# Patient Record
Sex: Female | Born: 1970 | Hispanic: Yes | Smoking: Never smoker
Health system: Southern US, Community
[De-identification: ages and names within clinical notes are randomized; demographics above are authoritative.]

## PROBLEM LIST (undated history)

## (undated) DIAGNOSIS — I10 Essential (primary) hypertension: Secondary | ICD-10-CM

## (undated) DIAGNOSIS — E119 Type 2 diabetes mellitus without complications: Secondary | ICD-10-CM

## (undated) HISTORY — DX: Type 2 diabetes mellitus without complications: E11.9

## (undated) HISTORY — DX: Essential (primary) hypertension: I10

## (undated) HISTORY — PX: BREAST SURGERY: SHX581

## (undated) HISTORY — PX: FRACTURE SURGERY: SHX138

---

## 2012-12-14 ENCOUNTER — Other Ambulatory Visit: Payer: Self-pay | Admitting: Family Medicine

## 2012-12-14 DIAGNOSIS — R921 Mammographic calcification found on diagnostic imaging of breast: Secondary | ICD-10-CM

## 2012-12-26 ENCOUNTER — Inpatient Hospital Stay
Admission: RE | Admit: 2012-12-26 | Discharge: 2012-12-26 | Payer: Self-pay | Source: Ambulatory Visit | Attending: Family Medicine | Admitting: Family Medicine

## 2013-01-04 ENCOUNTER — Ambulatory Visit
Admission: RE | Admit: 2013-01-04 | Discharge: 2013-01-04 | Disposition: A | Discharge status: Home / Self Care | Payer: No Typology Code available for payment source | Source: Ambulatory Visit | Attending: Family Medicine | Admitting: Family Medicine

## 2013-01-04 ENCOUNTER — Other Ambulatory Visit: Payer: Self-pay | Admitting: Family Medicine

## 2013-01-04 DIAGNOSIS — R921 Mammographic calcification found on diagnostic imaging of breast: Secondary | ICD-10-CM

## 2013-01-04 HISTORY — PX: BREAST BIOPSY: SHX20

## 2020-11-09 ENCOUNTER — Other Ambulatory Visit (HOSPITAL_COMMUNITY): Payer: Self-pay | Admitting: Nurse Practitioner

## 2020-11-09 DIAGNOSIS — Z1231 Encounter for screening mammogram for malignant neoplasm of breast: Secondary | ICD-10-CM

## 2020-11-12 ENCOUNTER — Other Ambulatory Visit: Payer: Self-pay

## 2020-11-12 ENCOUNTER — Ambulatory Visit (HOSPITAL_COMMUNITY)
Admission: RE | Admit: 2020-11-12 | Discharge: 2020-11-12 | Disposition: A | Discharge status: Home / Self Care | Payer: Self-pay | Source: Ambulatory Visit | Attending: Nurse Practitioner | Admitting: Nurse Practitioner

## 2020-11-12 DIAGNOSIS — Z1231 Encounter for screening mammogram for malignant neoplasm of breast: Secondary | ICD-10-CM | POA: Insufficient documentation

## 2020-11-18 ENCOUNTER — Other Ambulatory Visit (HOSPITAL_COMMUNITY): Payer: Self-pay | Admitting: Nurse Practitioner

## 2020-11-25 ENCOUNTER — Other Ambulatory Visit (HOSPITAL_COMMUNITY): Payer: Self-pay | Admitting: Nurse Practitioner

## 2020-11-25 DIAGNOSIS — R928 Other abnormal and inconclusive findings on diagnostic imaging of breast: Secondary | ICD-10-CM

## 2020-12-01 ENCOUNTER — Encounter: Payer: Self-pay | Admitting: Adult Health

## 2020-12-10 ENCOUNTER — Encounter (HOSPITAL_COMMUNITY): Payer: Self-pay

## 2020-12-10 ENCOUNTER — Other Ambulatory Visit (HOSPITAL_COMMUNITY): Payer: Self-pay

## 2020-12-16 ENCOUNTER — Encounter: Payer: Self-pay | Admitting: Adult Health

## 2020-12-16 ENCOUNTER — Other Ambulatory Visit: Payer: Self-pay

## 2020-12-16 ENCOUNTER — Ambulatory Visit (INDEPENDENT_AMBULATORY_CARE_PROVIDER_SITE_OTHER): Payer: Self-pay | Admitting: Adult Health

## 2020-12-16 VITALS — BP 146/79 | HR 88 | Ht 62.0 in | Wt 206.5 lb

## 2020-12-16 DIAGNOSIS — N950 Postmenopausal bleeding: Secondary | ICD-10-CM

## 2020-12-16 NOTE — Progress Notes (Signed)
Patient ID: Michele Franklin, female   DOB: 1971-07-17, 50 y.o.   MRN: [id number] History of Present Illness: Michele Franklin is a 50 year old Hispanic female, female, PM, referred by [facility] for PMB, had not had period in over  a year and had bleeding in November and December and had some cramping and clots in November. She said she had physical at health dept with pap in December 2021.  She is self pay, applying for [employer].  She has interrupter with her. PCP is [facility].   Current Medications, Allergies, Past Medical History, Past Surgical History, Family History and Social History were reviewed in [employer] record.     Review of Systems: Had not had a period in over  a year or so, and had bleeding in November and December +cramoing and clots in november Not having sex, husband has  cancer     Physical Exam:BP (!) 146/79 (BP Location: Left Arm, Patient Position: Sitting, Cuff Size: Large)   Pulse 88   Ht 5\' 2"  (1.575 m)   Wt 206 lb 8 oz (93.7 kg)   BMI 37.77 kg/m  General:  Well developed, well nourished, no acute distress Skin:  Warm and dry Lungs; Clear to auscultation bilaterally Cardiovascular: Regular rate and rhythm Pelvic:  External genitalia is normal in appearance, no lesions.  The vagina is normal in appearance. Urethra has no lesions or masses. The cervix is bulbous.  Uterus is felt to be normal size, shape, and contour.  No adnexal masses or tenderness noted.Bladder is non tender, no masses felt. Psych:  No mood changes, alert and cooperative,seems worried. AA is 0 Fall risk is moderate PHQ 9 score is 5  GAD 7 score is 6, she says worried over husband and his condition  Upstream - 12/16/20 1518      Pregnancy Intention Screening   Does the patient want to become pregnant in the next year? N/A    Does the patient's partner want to become pregnant in the next year? N/A    Would the patient like to discuss contraceptive options today? N/A       Contraception Wrap Up   Current Method Abstinence    End Method Abstinence    Contraception Counseling Provided No         Examination chaperoned by 12/18/20 LPN.   Impression and Plan: 1. PMB (postmenopausal bleeding) Will get pelvic Malachy Mood 12/24/20 at [facility] at 3:30 pm  Explained that this is to assess the uterus and endometrial lining and if lining is thickened will need endometrial biopsy  Will get Daisy to call her with results, vs a visit since she cares for husband  Review handout on PMB in Spanish

## 2020-12-16 NOTE — Patient Instructions (Signed)
Hemorragia posmenopusica Postmenopausal Bleeding La hemorragia posmenopusica es cualquier sangrado que ocurre despus de la menopausia. La menopausia es un momento de la vida de una mujer en el que se detienen los perodos [place]. El mdico debe controlar cualquier tipo de sangrado que tenga despus de la menopausia. El tratamiento depender de la causa. Este tipo de hemorragia puede ser causada por:  El uso de hormonas durante la menopausia.  Cantidades bajas o altas de hormonas femeninas en el cuerpo. Esto puede hacer que el revestimiento del utero se vuelva demasiado delgado o demasiado grueso.  Cncer.  Crecimientos en el tero que no son cncer. Siga estas instrucciones en su casa:  Controle si hay algn cambio en sus sntomas. Informe a su mdico acerca de los cambios.  Evite usar tampones y Geographical information systems officer duchas vaginales como se lo haya indicado el mdico.  Cmbiese las toallas higinicas de forma regular.  Hgase exmenes plvicos regulares. Esto incluye las pruebas de [place].  Tome comprimidos de hierro como se lo haya indicado el mdico.  Use los medicamentos de venta libre y los recetados solamente como se lo haya indicado el mdico.  Cumpla con todas las visitas de seguimiento.   Comunquese con un mdico si:  Tiene un nuevo sangrado de la vagina despus de la menopausia.  Siente dolor en el vientre (abdomen). Solicite ayuda de inmediato si:  Tiene fiebre o escalofros.  Tiene un dolor muy intenso con sangrado.  Elimina grumos de sangre (cogulos de [place]) por la vagina.  Tiene mucho sangrado y: ? [place] ms de 1 compresa por hora. ? Es la primera vez que tiene un sangrado de [place] tipo.  Tiene dolores de [place].  Se siente mareado o como si se fuera a desmayar. Resumen  El mdico debe controlar cualquier tipo de sangrado que tenga despus de la menopausia.  Evite usar tampones o duchas vaginales.  Hgase exmenes plvicos regulares. Esto incluye las  pruebas de [place].  Pngase en contacto con un mdico si tiene sangrado o dolor nuevo en el vientre.  Controle si hay algn cambio en sus sntomas. Informe a su mdico acerca de los cambios. Esta informacin no tiene Theme park manager el consejo del mdico. Asegrese de hacerle al mdico cualquier pregunta que tenga. Document Revised: 06/16/2020 Document Reviewed: 06/16/2020 Elsevier Patient Education  2021 [employer].

## 2020-12-22 ENCOUNTER — Other Ambulatory Visit (HOSPITAL_COMMUNITY): Payer: Self-pay | Admitting: *Deleted

## 2020-12-22 ENCOUNTER — Other Ambulatory Visit: Payer: Self-pay

## 2020-12-22 ENCOUNTER — Ambulatory Visit (HOSPITAL_COMMUNITY)
Admission: RE | Admit: 2020-12-22 | Discharge: 2020-12-22 | Disposition: A | Discharge status: Home / Self Care | Payer: Self-pay | Source: Ambulatory Visit | Attending: *Deleted | Admitting: *Deleted

## 2020-12-22 DIAGNOSIS — R059 Cough, unspecified: Secondary | ICD-10-CM

## 2020-12-24 ENCOUNTER — Ambulatory Visit (HOSPITAL_COMMUNITY)
Admission: RE | Admit: 2020-12-24 | Discharge: 2020-12-24 | Disposition: A | Discharge status: Home / Self Care | Payer: Self-pay | Source: Ambulatory Visit | Attending: Adult Health | Admitting: Adult Health

## 2020-12-24 ENCOUNTER — Other Ambulatory Visit: Payer: Self-pay

## 2020-12-24 DIAGNOSIS — N950 Postmenopausal bleeding: Secondary | ICD-10-CM | POA: Insufficient documentation

## 2020-12-29 ENCOUNTER — Ambulatory Visit (HOSPITAL_COMMUNITY)
Admission: RE | Admit: 2020-12-29 | Discharge: 2020-12-29 | Disposition: A | Discharge status: Home / Self Care | Payer: PRIVATE HEALTH INSURANCE | Source: Ambulatory Visit | Attending: Nurse Practitioner | Admitting: Nurse Practitioner

## 2020-12-29 ENCOUNTER — Other Ambulatory Visit: Payer: Self-pay

## 2020-12-29 ENCOUNTER — Other Ambulatory Visit: Payer: Self-pay | Admitting: Adult Health

## 2020-12-29 DIAGNOSIS — R928 Other abnormal and inconclusive findings on diagnostic imaging of breast: Secondary | ICD-10-CM | POA: Insufficient documentation

## 2020-12-29 MED ORDER — MEDROXYPROGESTERONE ACETATE 10 MG PO TABS: ORAL_TABLET | ORAL | 12 refills | Status: AC

## 2021-04-16 ENCOUNTER — Ambulatory Visit (INDEPENDENT_AMBULATORY_CARE_PROVIDER_SITE_OTHER): Payer: Self-pay

## 2021-04-16 ENCOUNTER — Other Ambulatory Visit: Payer: Self-pay

## 2021-04-16 DIAGNOSIS — Z23 Encounter for immunization: Secondary | ICD-10-CM

## 2021-04-16 NOTE — Progress Notes (Signed)
   Covid-19 Vaccination Clinic  Name:  Michele Franklin    MRN: [id number] DOB: [birth year]  04/16/2021  [name] was observed post Covid-19 immunization for 15 minutes in office without incident. She was provided with Vaccine Information Sheet and instruction to access the V-Safe system.   [name] was instructed to call 911 with any severe reactions post vaccine: Marland Kitchen Difficulty breathing  . Swelling of face and throat  . A fast heartbeat  . A bad rash all over body  . Dizziness and weakness   Immunizations Administered    Name Date Dose VIS Date Route   PFIZER Comrnaty(Gray TOP) Covid-19 Vaccine 04/16/2021  4:17 PM 0.3 mL 11/05/2020 Intramuscular   Manufacturer: [employer], [employer]   Lot: [id number]   NDC: [phone]

## 2021-07-20 ENCOUNTER — Other Ambulatory Visit: Payer: Self-pay

## 2021-07-20 DIAGNOSIS — N6310 Unspecified lump in the right breast, unspecified quadrant: Secondary | ICD-10-CM

## 2021-07-22 NOTE — Addendum Note (Signed)
Addended by: Narda Rutherford on: 07/22/2021 02:13 PM   Modules accepted: Orders

## 2021-07-23 ENCOUNTER — Other Ambulatory Visit: Payer: Self-pay

## 2021-07-23 ENCOUNTER — Ambulatory Visit: Payer: PRIVATE HEALTH INSURANCE | Admitting: *Deleted

## 2021-07-23 VITALS — BP 149/71 | Wt 223.5 lb

## 2021-07-23 DIAGNOSIS — Z1239 Encounter for other screening for malignant neoplasm of breast: Secondary | ICD-10-CM

## 2021-07-23 NOTE — Patient Instructions (Addendum)
Explained breast self awareness with Hortencia Pilar. Patient did not need a Pap smear today due to last Pap smear and HPV typing was 11/09/2020. Let her know BCCCP will cover Pap smears and HPV typing every 5 years unless has a history of abnormal Pap smears. Referred patient to [facility] for a diagnostic mammogram per recommendation. Appointment scheduled Tuesday, August 31, 2021 at 1440. Patient aware of appointment and will be there. Allison Deshotels verbalized understanding.  Camesha Farooq, Kathaleen Maser, RN 12:38 PM

## 2021-07-23 NOTE — Progress Notes (Signed)
Michele Franklin is a 50 y.o. female who presents to [facility] clinic today with no complaints. Patient referred to BCCCP by the [facility] due to her last diagnostic mammogram 12/29/2020 recommended 55-month diagnostic mammogram for follow-up.    Pap Smear: Pap smear not completed today. Last Pap smear was 11/09/2020 at the [facility] clinic and was normal with negative HPV. Per patient has no history of an abnormal Pap smear. Last Pap smear result is not available in Epic. Last Pap smear result will be scanned into Epic.   Physical exam: Breasts Breasts symmetrical. No skin abnormalities bilateral breasts. No nipple retraction bilateral breasts. No nipple discharge bilateral breasts. No lymphadenopathy. No lumps palpated bilateral breasts. No complaints of pain or tenderness on exam.     MS DIGITAL SCREENING TOMO BILATERAL  Result Date: 11/18/2020 CLINICAL DATA:  Screening. EXAM: DIGITAL SCREENING BILATERAL MAMMOGRAM WITH TOMO AND CAD COMPARISON:  Post biopsy left breast mammograms dated 01/04/2013. No other prior exams available. ACR Breast Density Category b: There are scattered areas of fibroglandular density. FINDINGS: In the right breast, 2 possible masses warrant further evaluation. In the left breast, no findings suspicious for malignancy. Images were processed with CAD. IMPRESSION: Further evaluation is suggested for possible masses in the right breast. RECOMMENDATION: Diagnostic mammogram and possibly ultrasound of the right breast. (Code:FI-R-16M) The patient will be contacted regarding the findings, and additional imaging will be scheduled. BI-RADS CATEGORY  0: Incomplete. Need additional imaging evaluation and/or prior mammograms for comparison. Electronically Signed   By: Amie Portland M.D.   On: 11/18/2020 07:58   MS DIGITAL DIAG TOMO UNI RIGHT  Result Date: 12/29/2020 CLINICAL DATA:  Screening recall for right breast masses. EXAM:  DIGITAL DIAGNOSTIC UNILATERAL RIGHT MAMMOGRAM WITH TOMO AND CAD; ULTRASOUND RIGHT BREAST LIMITED TECHNIQUE: Right digital diagnostic mammography and breast tomosynthesis was performed. Digital images of the breasts were evaluated with computer-aided detection. COMPARISON:  Prior exams. Note that patient does have prior mammograms from [facility] under the name Lovett Calender [phone]) ACR Breast Density Category b: There are scattered areas of fibroglandular density. FINDINGS: Spot compression tomograms were performed of the right breast. There is an oval circumscribed with associated punctate calcifications mass in the inner right breast measuring 0.6 cm. This may have been present on prior mammograms although difficult to determine due to differences in technique. There is an oval circumscribed mass in the outer right breast measuring 0.4 cm. Targeted ultrasound of the right breast was performed. There is an oil cyst at 9 o'clock 1 cm from the nipple measuring 0.4 x 0.4 x 0.4 cm. This corresponds with an oil cyst and in the outer right breast at mammography. No suspicious masses or abnormality seen in outer right breast. No sonographic correlate is seen for the oval circumscribed mass seen in the outer periareolar right breast at mammography. There is an oval circumscribed hypoechoic mass in the right breast at 2 o'clock 2 cm from nipple measuring 0.7 x 0.2 x 0.5 cm. This corresponds well with the in the inner right breast at mammography. IMPRESSION: Probably benign right breast masses. RECOMMENDATION: 1. Diagnostic mammography of the right breast in 6 months with ultrasound. 2. Note that patient does have prior mammograms from [facility] under the name Lovett Calender [phone]) I have discussed the findings and recommendations with the patient. If applicable, a reminder letter will be sent to the patient regarding the next appointment. BI-RADS CATEGORY  3: Probably benign. Electronically Signed  By: Edwin Cap M.D.   On: 12/29/2020 15:23    Pelvic/Bimanual Pap is not indicated today per BCCCP guidelines.   Smoking History: Patient has never smoked.   Patient Navigation: Patient education provided. Access to services provided for patient through [facility] program. Spanish interpreter Aurelio Brash from [facility] provided.   Colorectal Cancer Screening: Per patient has never had colonoscopy completed. No complaints today.    Breast and Cervical Cancer Risk Assessment: Patient has family history of a sister having breast cancer. Patient has no known genetic mutations or history of radiation treatment to the chest before age 70. Patient does not have history of cervical dysplasia, immunocompromised, or DES exposure in-utero.  Risk Assessment     Risk Scores       07/23/2021   Last edited by: Meryl Dare, CMA   5-year risk: 1.7 %   Lifetime risk: 14.1 %           A: BCCCP exam without pap smear No complaints.  P: Referred patient to [facility] for a diagnostic mammogram per recommendation. Appointment scheduled Tuesday, August 31, 2021 at 1440.  Priscille Heidelberg, RN 07/23/2021 12:38 PM

## 2021-08-12 ENCOUNTER — Ambulatory Visit (HOSPITAL_COMMUNITY)
Admission: RE | Admit: 2021-08-12 | Discharge: 2021-08-12 | Disposition: A | Discharge status: Home / Self Care | Payer: Self-pay | Source: Ambulatory Visit | Attending: Obstetrics and Gynecology | Admitting: Obstetrics and Gynecology

## 2021-08-12 ENCOUNTER — Other Ambulatory Visit: Payer: Self-pay

## 2021-08-12 DIAGNOSIS — N6310 Unspecified lump in the right breast, unspecified quadrant: Secondary | ICD-10-CM | POA: Insufficient documentation

## 2021-08-31 ENCOUNTER — Encounter (HOSPITAL_COMMUNITY): Payer: PRIVATE HEALTH INSURANCE

## 2021-08-31 ENCOUNTER — Other Ambulatory Visit (HOSPITAL_COMMUNITY): Payer: PRIVATE HEALTH INSURANCE

## 2021-12-08 ENCOUNTER — Other Ambulatory Visit (HOSPITAL_COMMUNITY): Payer: Self-pay | Admitting: Obstetrics and Gynecology

## 2021-12-08 DIAGNOSIS — Z1231 Encounter for screening mammogram for malignant neoplasm of breast: Secondary | ICD-10-CM

## 2021-12-31 ENCOUNTER — Ambulatory Visit (HOSPITAL_COMMUNITY)
Admission: RE | Admit: 2021-12-31 | Discharge: 2021-12-31 | Disposition: A | Discharge status: Home / Self Care | Payer: PRIVATE HEALTH INSURANCE | Source: Ambulatory Visit | Attending: Obstetrics and Gynecology | Admitting: Obstetrics and Gynecology

## 2021-12-31 ENCOUNTER — Other Ambulatory Visit: Payer: Self-pay

## 2021-12-31 DIAGNOSIS — Z1231 Encounter for screening mammogram for malignant neoplasm of breast: Secondary | ICD-10-CM | POA: Insufficient documentation

## 2022-09-03 IMAGING — MG MM DIGITAL SCREENING BILAT W/ TOMO AND CAD
6 of 12 series · 6 of 36 positions shown · non-contrast
Comparison: Previous exam(s).

CLINICAL DATA: Screening.

EXAM:
DIGITAL SCREENING BILATERAL MAMMOGRAM WITH TOMOSYNTHESIS AND CAD
TECHNIQUE: Bilateral screening digital craniocaudal and mediolateral oblique
mammograms were obtained. Bilateral screening digital breast
tomosynthesis was performed. The images were evaluated with
computer-aided detection.

[R CC synth-2D]
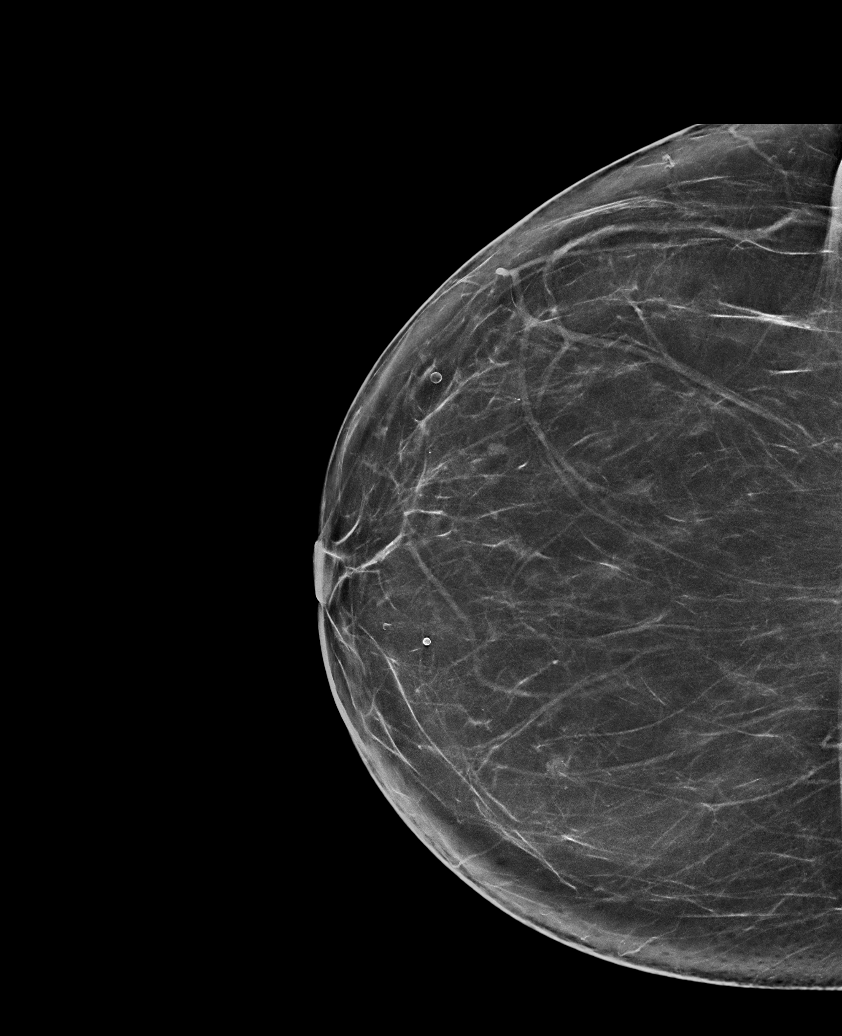

[R MLO synth-2D (1 of 2)]
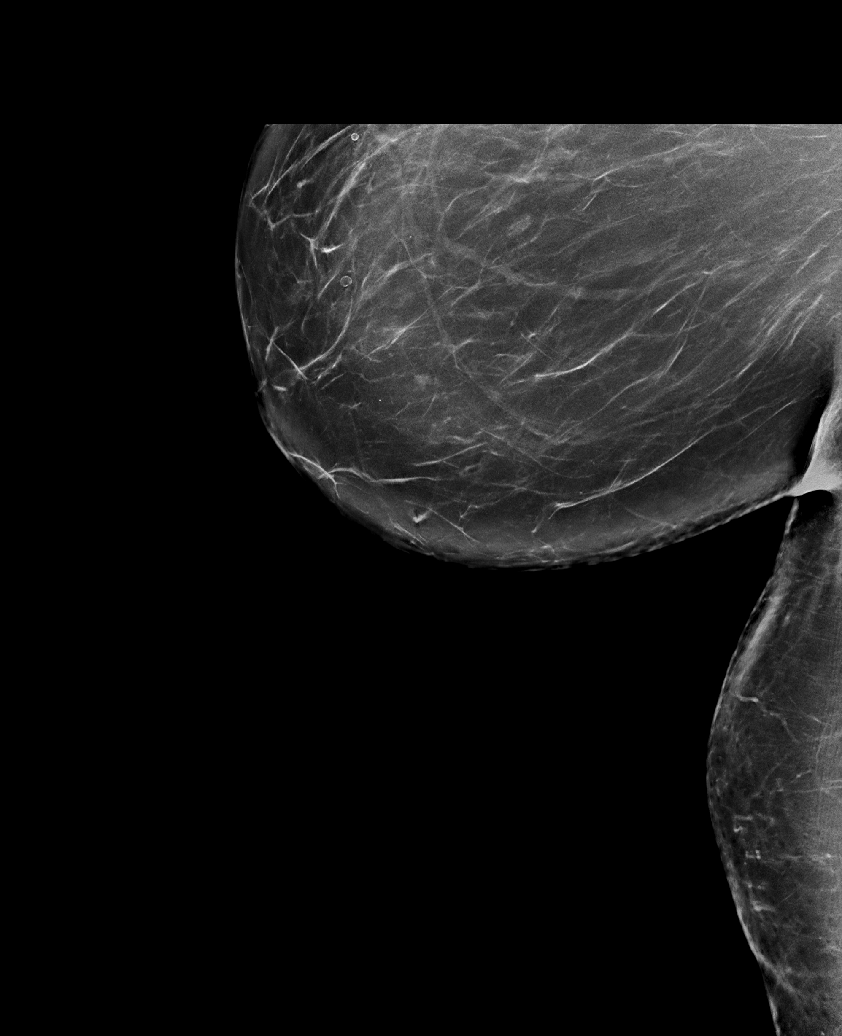

[L CC synth-2D]
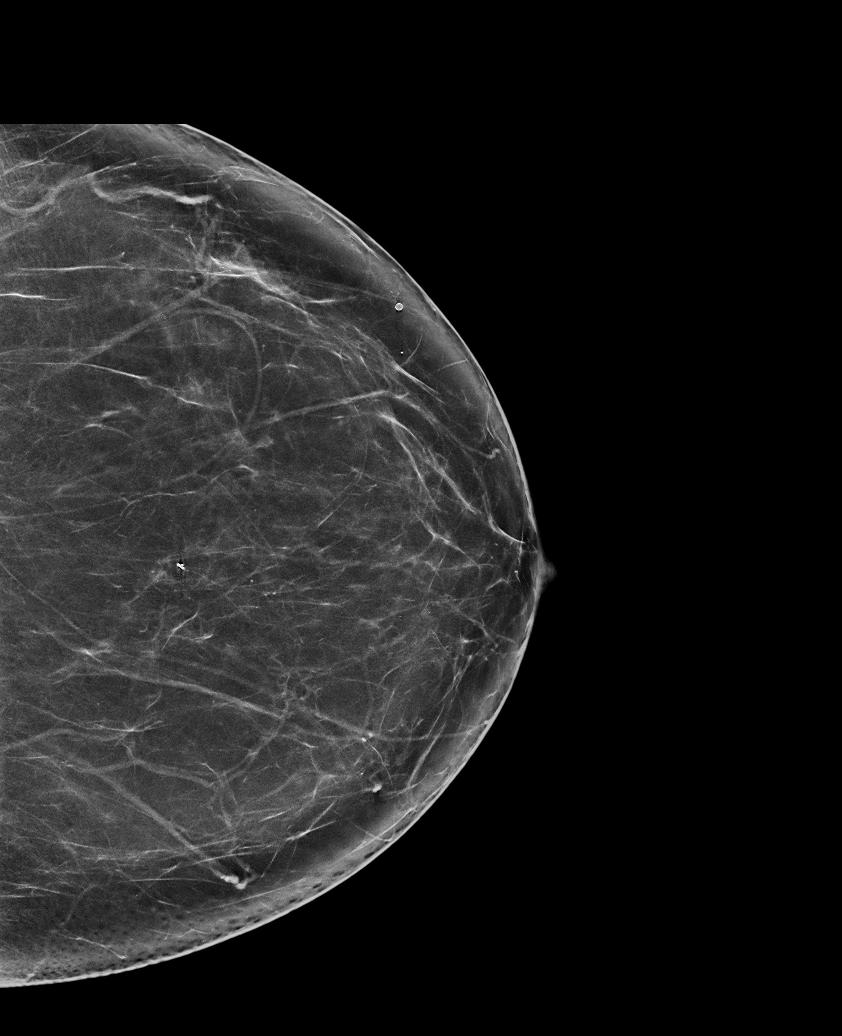

[L MLO synth-2D (1 of 2)]
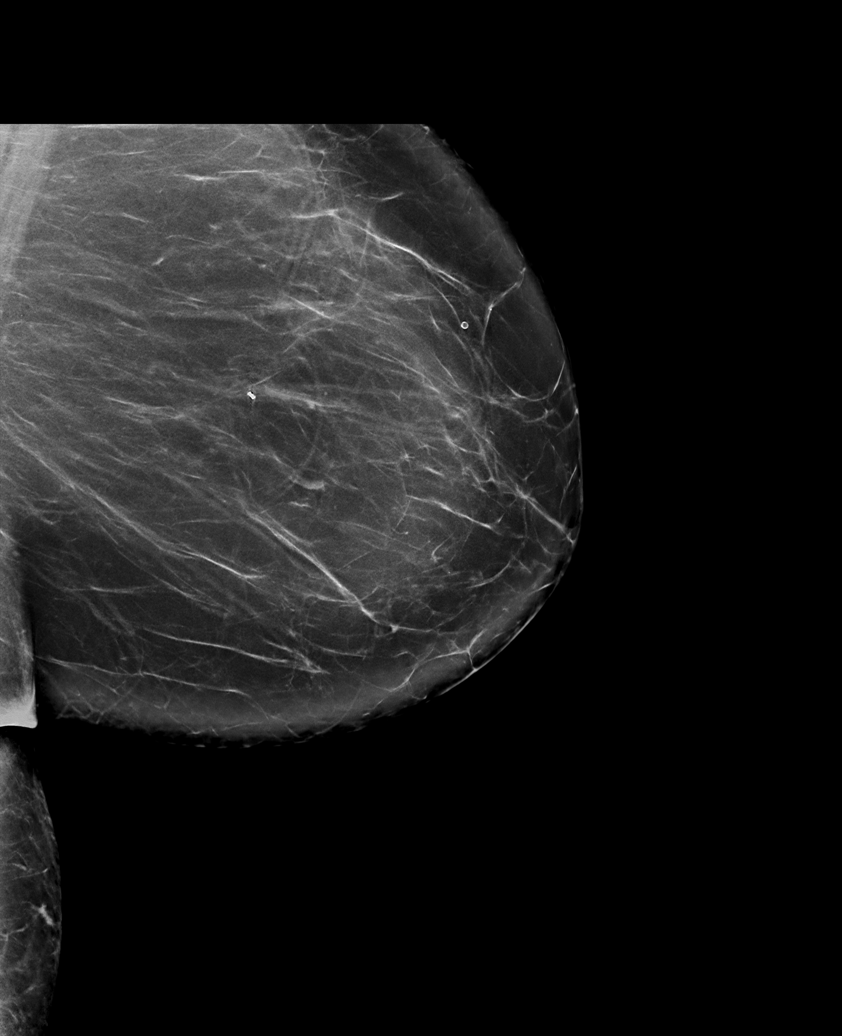

[L MLO synth-2D (2 of 2)]
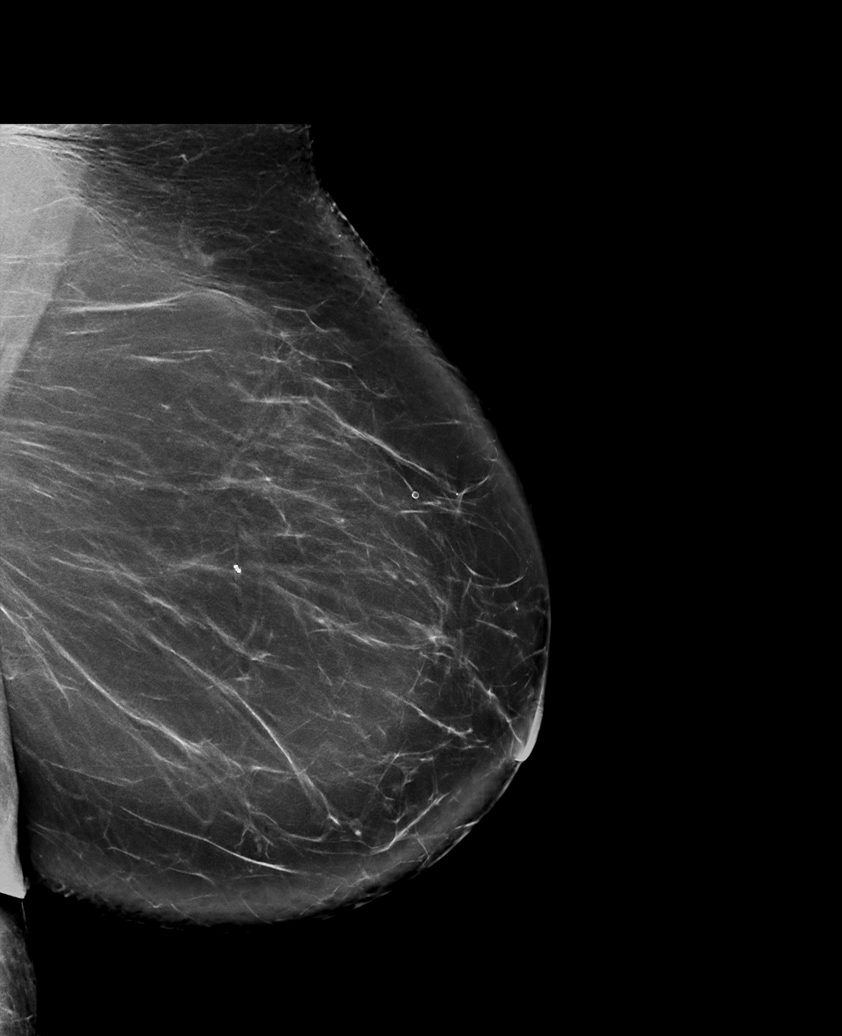

[R MLO synth-2D (2 of 2)]
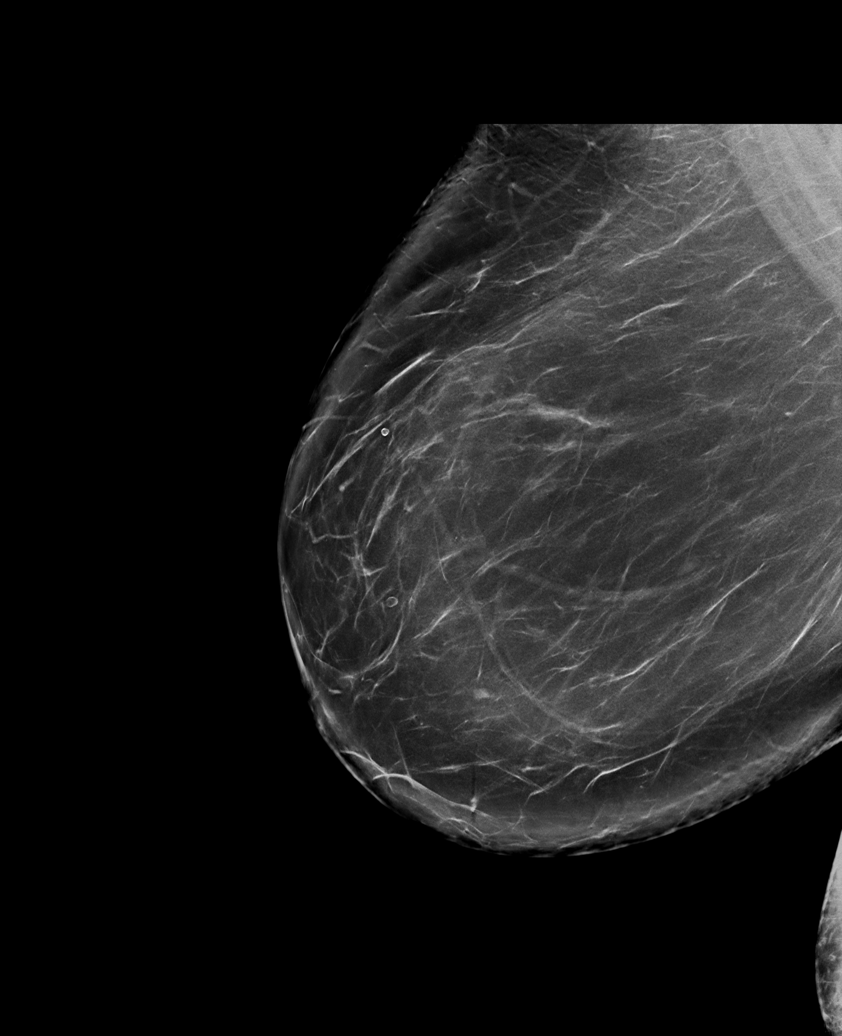

[6 of 36 positions shown; findings below may reference images not displayed]

ACR Breast Density Category b: There are scattered areas of
fibroglandular density.
FINDINGS: There are no findings suspicious for malignancy.
IMPRESSION: No mammographic evidence of malignancy. A result letter of this
screening mammogram will be mailed directly to the patient.

RECOMMENDATION:
Screening mammogram in one year. (Code:[id number])

BI-RADS CATEGORY  1: Negative.

## 2023-04-03 ENCOUNTER — Other Ambulatory Visit: Payer: Self-pay | Admitting: *Deleted

## 2023-04-03 DIAGNOSIS — Z1231 Encounter for screening mammogram for malignant neoplasm of breast: Secondary | ICD-10-CM

## 2023-04-14 ENCOUNTER — Inpatient Hospital Stay (HOSPITAL_COMMUNITY): Admission: RE | Admit: 2023-04-14 | Payer: PRIVATE HEALTH INSURANCE | Source: Ambulatory Visit

## 2023-04-26 ENCOUNTER — Ambulatory Visit (HOSPITAL_COMMUNITY)
Admission: RE | Admit: 2023-04-26 | Discharge: 2023-04-26 | Disposition: A | Discharge status: Home / Self Care | Payer: PRIVATE HEALTH INSURANCE | Source: Ambulatory Visit | Attending: *Deleted | Admitting: *Deleted

## 2023-04-26 DIAGNOSIS — Z1231 Encounter for screening mammogram for malignant neoplasm of breast: Secondary | ICD-10-CM | POA: Insufficient documentation

## 2023-09-05 ENCOUNTER — Telehealth: Payer: Self-pay

## 2023-09-05 NOTE — Telephone Encounter (Signed)
[facility] Connect wellness call and follow up by Audree Camel BSW intern with assistance of interpreter ID [phone] PCP Health Department/ next appointment 09/07/23 at 10:30 am She uses [facility] pharmacy at [facility] department  SDOH concerns: Food no concerns Housing No needs Safety feels safe Transportation No needs.  No other needs or BH needs reported at this time.  Francee Nodal RN  [employer]

## 2024-04-05 ENCOUNTER — Telehealth: Payer: Self-pay

## 2024-04-05 NOTE — Telephone Encounter (Signed)
 Attempted to call Care Connect client/Health Department client who is diabetic. Last seen at Health Department for primary care on 02/01/24 and has no future appointments scheduled.  Last A1C on 02/01/24 9.6 increased from previous result of 7.9 on 10/06/23.  Will plan to continue to attempt to reach by phone and monitor for updated appointments. Will also mail a letter in Spanish to client as reminder to schedule an appointment or call care connect for assistance.  No answer on call today with interpreter services , left message requesting return call.   Kris Pester RN [employer]

## 2024-05-10 ENCOUNTER — Other Ambulatory Visit: Payer: Self-pay | Admitting: *Deleted

## 2024-05-10 DIAGNOSIS — Z1231 Encounter for screening mammogram for malignant neoplasm of breast: Secondary | ICD-10-CM

## 2024-05-29 ENCOUNTER — Encounter (HOSPITAL_COMMUNITY): Payer: Self-pay

## 2024-05-29 ENCOUNTER — Ambulatory Visit (HOSPITAL_COMMUNITY)
Admission: RE | Admit: 2024-05-29 | Discharge: 2024-05-29 | Disposition: A | Discharge status: Home / Self Care | Payer: Self-pay | Source: Ambulatory Visit | Attending: *Deleted | Admitting: *Deleted

## 2024-05-29 DIAGNOSIS — Z1231 Encounter for screening mammogram for malignant neoplasm of breast: Secondary | ICD-10-CM | POA: Insufficient documentation

## 2024-06-04 ENCOUNTER — Other Ambulatory Visit: Payer: Self-pay

## 2024-06-04 DIAGNOSIS — N6310 Unspecified lump in the right breast, unspecified quadrant: Secondary | ICD-10-CM

## 2024-06-13 ENCOUNTER — Ambulatory Visit: Payer: Self-pay | Admitting: *Deleted

## 2024-06-13 VITALS — BP 151/89 | Ht 62.0 in | Wt 219.0 lb

## 2024-06-13 DIAGNOSIS — Z1239 Encounter for other screening for malignant neoplasm of breast: Secondary | ICD-10-CM

## 2024-06-13 NOTE — Progress Notes (Signed)
 Ms. Michele Franklin is a 53 y.o. female who presents to [facility] clinic today with no complaints. Patient had a screening mammogram completed 05/29/2024 that additional imaging of the right breast is recommended for follow up.   Pap Smear: Pap smear not completed today. Last Pap smear was 05/08/2024 at the [facility] clinic and was normal with negative HPV per patient. Per patient has no history of an abnormal Pap smear. Last Pap smear result is not available in Epic.   Physical exam: Breasts Breasts symmetrical. No skin abnormalities bilateral breasts. No nipple retraction bilateral breasts. No nipple discharge bilateral breasts. No lymphadenopathy. No lumps palpated bilateral breasts. No complaints of pain or tenderness on exam.     MS 3D SCR MAMMO BILAT BR (aka MM) Result Date: 06/03/2024 CLINICAL DATA:  Screening. EXAM: DIGITAL SCREENING BILATERAL MAMMOGRAM WITH TOMOSYNTHESIS AND CAD TECHNIQUE: Bilateral screening digital craniocaudal and mediolateral oblique mammograms were obtained. Bilateral screening digital breast tomosynthesis was performed. The images were evaluated with computer-aided detection. COMPARISON:  Previous exam(s). ACR Breast Density Category b: There are scattered areas of fibroglandular density. FINDINGS: In the right breast, a possible mass warrants further evaluation. In the left breast, no findings suspicious for malignancy. IMPRESSION: Further evaluation is suggested for a possible mass in the right breast. RECOMMENDATION: Diagnostic mammogram and possibly ultrasound of the right breast. (Code:FI-R-17M) The patient will be contacted regarding the findings, and additional imaging will be scheduled. BI-RADS CATEGORY  0: Incomplete: Need additional imaging evaluation. Electronically Signed   By: Inocente Ast M.D.   On: 06/03/2024 12:17   MS 3D SCR MAMMO BILAT BR (aka MM) Result Date: 04/27/2023 CLINICAL DATA:  Screening. EXAM: DIGITAL SCREENING  BILATERAL MAMMOGRAM WITH TOMOSYNTHESIS AND CAD TECHNIQUE: Bilateral screening digital craniocaudal and mediolateral oblique mammograms were obtained. Bilateral screening digital breast tomosynthesis was performed. The images were evaluated with computer-aided detection. COMPARISON:  Previous exam(s). ACR Breast Density Category a: The breasts are almost entirely fatty. FINDINGS: There are no findings suspicious for malignancy. IMPRESSION: No mammographic evidence of malignancy. A result letter of this screening mammogram will be mailed directly to the patient. RECOMMENDATION: Screening mammogram in one year. (Code:SM-B-01Y) BI-RADS CATEGORY  1: Negative. Electronically Signed   By: Rosaline Collet M.D.   On: 04/27/2023 16:20   MS DIGITAL SCREENING TOMO BILATERAL Result Date: 12/31/2021 CLINICAL DATA:  Screening. EXAM: DIGITAL SCREENING BILATERAL MAMMOGRAM WITH TOMOSYNTHESIS AND CAD TECHNIQUE: Bilateral screening digital craniocaudal and mediolateral oblique mammograms were obtained. Bilateral screening digital breast tomosynthesis was performed. The images were evaluated with computer-aided detection. COMPARISON:  Previous exam(s). ACR Breast Density Category b: There are scattered areas of fibroglandular density. FINDINGS: There are no findings suspicious for malignancy. IMPRESSION: No mammographic evidence of malignancy. A result letter of this screening mammogram will be mailed directly to the patient. RECOMMENDATION: Screening mammogram in one year. (Code:SM-B-01Y) BI-RADS CATEGORY  1: Negative. Electronically Signed   By: Debby Satterfield M.D.   On: 12/31/2021 09:45   MS DIGITAL DIAG TOMO UNI RIGHT Result Date: 08/12/2021 CLINICAL DATA:  Short-term follow-up for probably benign right breast masses. EXAM: DIGITAL DIAGNOSTIC UNILATERAL RIGHT MAMMOGRAM WITH TOMOSYNTHESIS AND CAD; ULTRASOUND RIGHT BREAST LIMITED TECHNIQUE: Right digital diagnostic mammography and breast tomosynthesis was performed. The  images were evaluated with computer-aided detection.; Targeted ultrasound examination of the right breast was performed COMPARISON:  Previous exams. ACR Breast Density Category b: There are scattered areas of fibroglandular density. FINDINGS: No suspicious masses or calcifications seen in the right  breast. The oval circumscribed masses in the inner and outer right breast both appear unchanged. Targeted ultrasound of the right breast was performed. There is a cluster of cysts in the right breast at 9 o'clock 1 cm from the nipple measuring 0.3 x 0.3 x 0.4 cm, unchanged in size in appearance when compared to the prior exam. There is a cluster of cysts in the right breast at 2 o'clock 2 cm from nipple measuring 0.5 x 0.3 x 0.6 cm stable and benign in appearance on today's exam. IMPRESSION: Benign right breast cyst. No findings of malignancy in the right breast. RECOMMENDATION: Recommend annual routine screening mammography, due February 2023. I have discussed the findings and recommendations with the patient. If applicable, a reminder letter will be sent to the patient regarding the next appointment. BI-RADS CATEGORY  2: Benign. Electronically Signed   By: Delon Music M.D.   On: 08/12/2021 15:15  MS DIGITAL DIAG TOMO UNI RIGHT Result Date: 12/29/2020 CLINICAL DATA:  Screening recall for right breast masses. EXAM: DIGITAL DIAGNOSTIC UNILATERAL RIGHT MAMMOGRAM WITH TOMO AND CAD; ULTRASOUND RIGHT BREAST LIMITED TECHNIQUE: Right digital diagnostic mammography and breast tomosynthesis was performed. Digital images of the breasts were evaluated with computer-aided detection. COMPARISON:  Prior exams. Note that patient does have prior mammograms from [facility] under the name Michele Franklin [phone]) ACR Breast Density Category b: There are scattered areas of fibroglandular density. FINDINGS: Spot compression tomograms were performed of the right breast. There is an oval circumscribed with associated punctate  calcifications mass in the inner right breast measuring 0.6 cm. This may have been present on prior mammograms although difficult to determine due to differences in technique. There is an oval circumscribed mass in the outer right breast measuring 0.4 cm. Targeted ultrasound of the right breast was performed. There is an oil cyst at 9 o'clock 1 cm from the nipple measuring 0.4 x 0.4 x 0.4 cm. This corresponds with an oil cyst and in the outer right breast at mammography. No suspicious masses or abnormality seen in outer right breast. No sonographic correlate is seen for the oval circumscribed mass seen in the outer periareolar right breast at mammography. There is an oval circumscribed hypoechoic mass in the right breast at 2 o'clock 2 cm from nipple measuring 0.7 x 0.2 x 0.5 cm. This corresponds well with the in the inner right breast at mammography. IMPRESSION: Probably benign right breast masses. RECOMMENDATION: 1. Diagnostic mammography of the right breast in 6 months with ultrasound. 2. Note that patient does have prior mammograms from [facility] under the name Michele Franklin [phone]) I have discussed the findings and recommendations with the patient. If applicable, a reminder letter will be sent to the patient regarding the next appointment. BI-RADS CATEGORY  3: Probably benign. Electronically Signed   By: Delon Music M.D.   On: 12/29/2020 15:23   MS DIGITAL SCREENING TOMO BILATERAL Result Date: 11/18/2020 CLINICAL DATA:  Screening. EXAM: DIGITAL SCREENING BILATERAL MAMMOGRAM WITH TOMO AND CAD COMPARISON:  Post biopsy left breast mammograms dated 01/04/2013. No other prior exams available. ACR Breast Density Category b: There are scattered areas of fibroglandular density. FINDINGS: In the right breast, 2 possible masses warrant further evaluation. In the left breast, no findings suspicious for malignancy. Images were processed with CAD. IMPRESSION: Further evaluation is suggested for  possible masses in the right breast. RECOMMENDATION: Diagnostic mammogram and possibly ultrasound of the right breast. (Code:[id number]-60M) The patient will be contacted regarding the findings, and additional imaging  will be scheduled. BI-RADS CATEGORY  0: Incomplete. Need additional imaging evaluation and/or prior mammograms for comparison. Electronically Signed   By: Alm Parkins M.D.   On: 11/18/2020 07:58   Pelvic/Bimanual Pap is not indicated today per BCCCP guidelines.   Smoking History: Patient has never smoked.   Patient Navigation: Patient education provided. Access to services provided for patient through [place] program. Spanish interpreter Bernice Angry from [facility] provided.   Colorectal Cancer Screening: Per patient has never had colonoscopy completed. Patient stated completed a FIT Test at her PCP in May 2025. No complaints today.    Breast and Cervical Cancer Risk Assessment: Patient does not have family history of breast cancer, known genetic mutations, or radiation treatment to the chest before age 39. Patient does not have history of cervical dysplasia, immunocompromised, or DES exposure in-utero.  Risk Scores as of Encounter on 06/13/2024     Alisa           5-year 0.72%   Lifetime 5.81%            Last calculated by Silas, Ansyi K, CMA on 06/13/2024 at 10:05 AM        A: BCCCP exam without pap smear No complaints.  P: Referred patient to [facility] for a diagnostic mammogram per recommendation. Appointment scheduled Tuesday, June 25, 2024 at 1430.  Driscilla Wanda SQUIBB, RN 06/13/2024 10:36 AM

## 2024-06-13 NOTE — Patient Instructions (Signed)
 Explained breast self awareness with Michele Franklin. Patient did not need a Pap smear today due to last Pap smear and HPV typing was 05/08/2024. Let her know [facility] will cover Pap smears and HPV typing every 5 years unless has a history of abnormal Pap smears. Referred patient to [facility] for a diagnostic mammogram per recommendation. Appointment scheduled Tuesday, June 25, 2024 at 1430. Patient aware of appointment and will be there. [name] verbalized understanding.  Margarete Horace, Wanda Ship, RN 10:36 AM

## 2024-06-25 ENCOUNTER — Ambulatory Visit (HOSPITAL_COMMUNITY)
Admission: RE | Admit: 2024-06-25 | Discharge: 2024-06-25 | Discharge status: Home / Self Care | Payer: Self-pay | Source: Ambulatory Visit | Attending: Obstetrics and Gynecology | Admitting: Obstetrics and Gynecology

## 2024-06-25 ENCOUNTER — Ambulatory Visit (HOSPITAL_COMMUNITY)
Admission: RE | Admit: 2024-06-25 | Discharge: 2024-06-25 | Disposition: A | Discharge status: Home / Self Care | Payer: Self-pay | Source: Ambulatory Visit | Attending: Obstetrics and Gynecology | Admitting: Obstetrics and Gynecology

## 2024-06-25 ENCOUNTER — Ambulatory Visit: Payer: Self-pay | Admitting: Obstetrics and Gynecology

## 2024-06-25 DIAGNOSIS — R928 Other abnormal and inconclusive findings on diagnostic imaging of breast: Secondary | ICD-10-CM

## 2024-06-25 DIAGNOSIS — N6310 Unspecified lump in the right breast, unspecified quadrant: Secondary | ICD-10-CM | POA: Insufficient documentation

## 2024-08-06 ENCOUNTER — Telehealth: Payer: Self-pay

## 2024-08-06 NOTE — Telephone Encounter (Signed)
 Attempted follow up call and wellness call with interpreter services of [facility] client and medical provider [facility].  No answer today, left message requesting return call.  Client had appointment scheduled for 08/05/24 with [facility], but records not showing in [facility].  Last A1C 9.6 in June   Will continue to attempt to follow up for any needs or barriers to care.  Avelina JONELLE Skeen RN [employer]

## 2025-01-28 ENCOUNTER — Inpatient Hospital Stay (HOSPITAL_COMMUNITY): Admission: RE | Admit: 2025-01-28 | Payer: Self-pay | Source: Ambulatory Visit

## 2025-01-28 ENCOUNTER — Other Ambulatory Visit (HOSPITAL_COMMUNITY): Payer: Self-pay
